# Patient Record
Sex: Female | Born: 1965 | Race: White | Hispanic: No | Marital: Married | Smoking: Never smoker
Health system: Southern US, Community
[De-identification: ages and names within clinical notes are randomized; demographics above are authoritative.]

## PROBLEM LIST (undated history)

## (undated) DIAGNOSIS — K5090 Crohn's disease, unspecified, without complications: Secondary | ICD-10-CM

## (undated) HISTORY — PX: COLON SURGERY: SHX602

## (undated) HISTORY — PX: CHOLECYSTECTOMY: SHX55

---

## 2004-04-09 ENCOUNTER — Ambulatory Visit (HOSPITAL_COMMUNITY): Admission: RE | Admit: 2004-04-09 | Discharge: 2004-04-09 | Payer: Self-pay | Admitting: *Deleted

## 2006-04-25 IMAGING — RF DG HYSTEROGRAM
5 series · 5 of 5 positions shown · IV contrast (omnipaque)
Comparison: none

CLINICAL DATA: Known Crohn?s disease.  Primary infertility.  Assess tubal patency.
HYSTEROGRAM:
Following sterile cleaning of the external cervical os with Betadine, hysterosalpingography was performed via placement of a 5 French hysterosalpingography catheter into the endocervical canal where it stabilized with an end catheter balloon.  Approximately 15 cc of Omnipaque 300 was injected into the endometrial Thomasgle without difficulty.  
A normal endometrial morphology is seen.  Both fallopian tubes demonstrate a normal morphology.  Bilateral intraperitoneal spill was documented, however on both sides the contrast appears to loculate in a periovarian location.  A small amount of free intraperitoneal spill is seen on the left, but none was clearly demonstrated on the right, and the overall appearance raises the possibility of bilateral periovarian adhesions.  On the post-drainage film loculation of contrast in both periovarian locations persists although some free intraperitoneal spill is confirmed on the left as well.

[Series 1: run · 1 of 1 slices shown (1 of 5)]
[im 1/1]
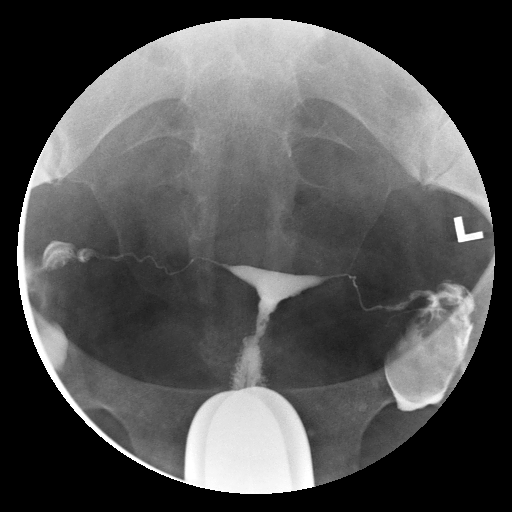

[Series 2: run · 1 of 1 slices shown (2 of 5)]
[im 1/1]
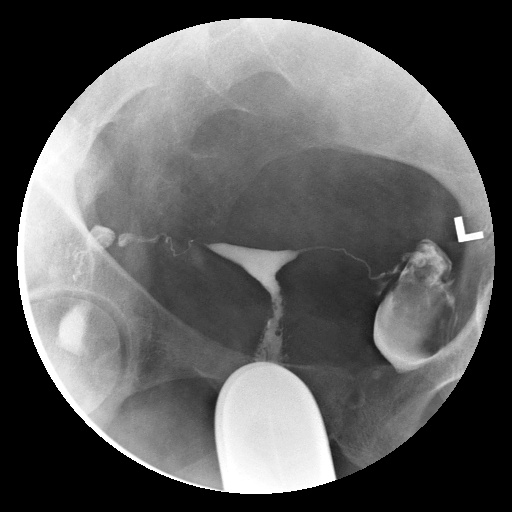

[Series 3: run · 1 of 1 slices shown (3 of 5)]
[im 1/1]
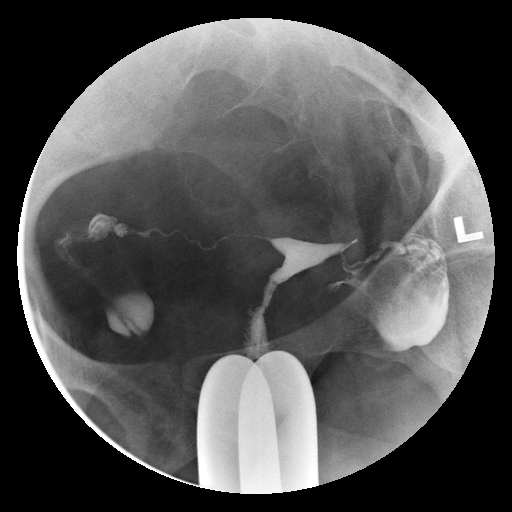

[Series 4: run · 1 of 1 slices shown (4 of 5)]
[im 1/1]
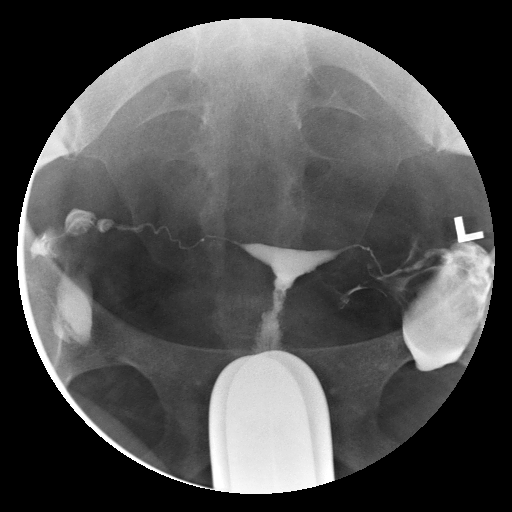

[Series 5: run · 1 of 1 slices shown (5 of 5)]
[im 1/1]
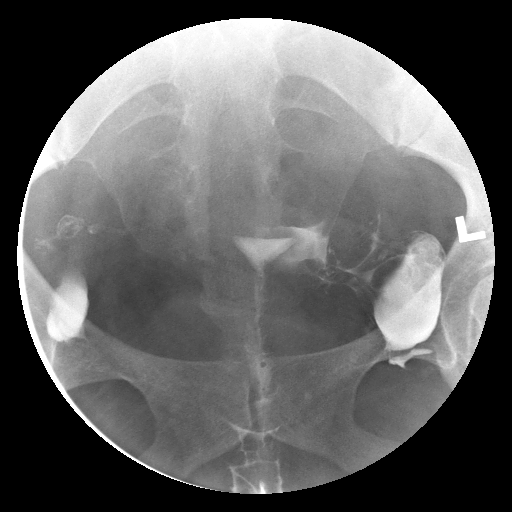

[5 of 5 positions shown; findings below may reference images not displayed]

IMPRESSION: Findings worrisome for bilateral periovarian adhesions as described above.

## 2010-03-29 ENCOUNTER — Encounter: Payer: Self-pay | Admitting: *Deleted

## 2017-03-27 ENCOUNTER — Other Ambulatory Visit: Payer: Self-pay

## 2017-03-27 ENCOUNTER — Encounter (HOSPITAL_BASED_OUTPATIENT_CLINIC_OR_DEPARTMENT_OTHER): Payer: Self-pay | Admitting: *Deleted

## 2017-03-27 ENCOUNTER — Emergency Department (HOSPITAL_BASED_OUTPATIENT_CLINIC_OR_DEPARTMENT_OTHER)
Admission: EM | Admit: 2017-03-27 | Discharge: 2017-03-27 | Disposition: A | Discharge status: Home / Self Care | Payer: PRIVATE HEALTH INSURANCE | Attending: Emergency Medicine | Admitting: Emergency Medicine

## 2017-03-27 DIAGNOSIS — R519 Headache, unspecified: Secondary | ICD-10-CM

## 2017-03-27 DIAGNOSIS — G501 Atypical facial pain: Secondary | ICD-10-CM | POA: Insufficient documentation

## 2017-03-27 DIAGNOSIS — R51 Headache: Secondary | ICD-10-CM | POA: Diagnosis not present

## 2017-03-27 HISTORY — DX: Crohn's disease, unspecified, without complications: K50.90

## 2017-03-27 MED ORDER — CYCLOBENZAPRINE HCL 10 MG PO TABS: 10.0000 mg | ORAL_TABLET | Freq: Two times a day (BID) | ORAL | 0 refills | Status: AC | PRN

## 2017-03-27 MED ORDER — CYCLOBENZAPRINE HCL 10 MG PO TABS
10.0000 mg | ORAL_TABLET | Freq: Once | ORAL | Status: AC
  Administered 2017-03-27: 10 mg via ORAL
  Filled 2017-03-27: qty 1

## 2017-03-27 MED ORDER — AMOXICILLIN 500 MG PO CAPS: 500.0000 mg | ORAL_CAPSULE | Freq: Three times a day (TID) | ORAL | 0 refills | Status: AC

## 2017-03-27 MED ORDER — HYDROCODONE-ACETAMINOPHEN 5-325 MG PO TABS
1.0000 | ORAL_TABLET | Freq: Once | ORAL | Status: AC
  Administered 2017-03-27: 1 via ORAL
  Filled 2017-03-27: qty 1

## 2017-03-27 MED ORDER — HYDROCODONE-ACETAMINOPHEN 5-325 MG PO TABS: 1.0000 | ORAL_TABLET | Freq: Four times a day (QID) | ORAL | 0 refills | Status: AC | PRN

## 2017-03-27 NOTE — ED Provider Notes (Signed)
[facility] EMERGENCY DEPARTMENT Provider Note   CSN: [id number] Arrival date & time: 03/27/17  2011     History   Chief Complaint Chief Complaint  Patient presents with  . Facial Pain    HPI Angel Porter is a 52 y.o. female.  Patient is a 52 year old female with a history of Crohn's disease presenting today with severe facial pain that Started over the last 4 days.  It initially started as an occasional searing pain over the left side of the jaw and TMJ area.  However of the days of progress the pain is become more frequent and more debilitating.  Patient does state that 3 months ago she had multiple dental procedures done to the teeth on that side of the mouth and had some issues with sensitivity and had to return to her dentist but it did been okay.  This started out of the blue.  It is slightly worse with opening her mouth but not necessarily related to temperature or if she is eating anything.  She states the pain comes and goes but when it comes its an electric sharp pain that is not improved by anything.  She has been taking Tylenol and ibuprofen without improvement.  Today she is getting multiple episodes an hour that last several minutes at a time.  No congestion, fever, difficulty swallowing.  She is getting some ear pain in addition to the facial pain.  No vision changes or eye pain.   The history is provided by the patient.    Past Medical History:  Diagnosis Date  . Crohn disease (HCC)     There are no active problems to display for this patient.   Past Surgical History:  Procedure Laterality Date  . CESAREAN SECTION    . CHOLECYSTECTOMY    . COLON SURGERY      OB History    No data available       Home Medications    Prior to Admission medications   Medication Sig Start Date End Date Taking? Authorizing Provider  amoxicillin (AMOXIL) 500 MG capsule Take 1 capsule (500 mg total) by mouth 3 (three) times daily. 03/27/17   Gwyneth SproutPlunkett, Chasten Blaze, MD    cyclobenzaprine (FLEXERIL) 10 MG tablet Take 1 tablet (10 mg total) by mouth 2 (two) times daily as needed for muscle spasms. 03/27/17   Gwyneth SproutPlunkett, Orion Vandervort, MD  HYDROcodone-acetaminophen (NORCO/VICODIN) 5-325 MG tablet Take 1-2 tablets by mouth every 6 (six) hours as needed for severe pain. 03/27/17   Gwyneth SproutPlunkett, Iley Breeden, MD    Family History No family history on file.  Social History Social History   Tobacco Use  . Smoking status: Never Smoker  . Smokeless tobacco: Never Used  Substance Use Topics  . Alcohol use: No    Frequency: Never  . Drug use: No     Allergies   Patient has no known allergies.   Review of Systems Review of Systems  All other systems reviewed and are negative.    Physical Exam Updated Vital Signs BP (!) 144/69 (BP Location: Left Arm)   Pulse 79   Temp 98.5 F (36.9 C) (Oral)   Resp 18   Ht 5\' 5"  (1.651 m)   Wt 114.3 kg (252 lb)   SpO2 99%   BMI 41.93 kg/m   Physical Exam  Constitutional: She is oriented to person, place, and time. She appears well-developed and well-nourished. No distress.  HENT:  Head: Normocephalic and atraumatic.  Left Ear: Tympanic membrane normal.  Mouth/Throat: Oropharynx is clear and moist. No trismus in the jaw. No uvula swelling or dental caries.    Eyes: EOM are normal. Pupils are equal, round, and reactive to light.  Cardiovascular: Normal rate.  Pulmonary/Chest: Effort normal.  Neurological: She is alert and oriented to person, place, and time.  No facial droop, sensation changes.  Extraocular movements are intact.  Skin: Skin is warm and dry.  Psychiatric: She has a normal mood and affect. Her behavior is normal.  Nursing note and vitals reviewed.    ED Treatments / Results  Labs (all labs ordered are listed, but only abnormal results are displayed) Labs Reviewed - No data to display  EKG  EKG Interpretation None       Radiology No results found.  Procedures Procedures (including critical  care time)  Medications Ordered in ED Medications  HYDROcodone-acetaminophen (NORCO/VICODIN) 5-325 MG per tablet 1 tablet (1 tablet Oral Given 03/27/17 2151)  cyclobenzaprine (FLEXERIL) tablet 10 mg (10 mg Oral Given 03/27/17 2150)     Initial Impression / Assessment and Plan / ED Course  I have reviewed the triage vital signs and the nursing notes.  Pertinent labs & imaging results that were available during my care of the patient were reviewed by me and considered in my medical decision making (see chart for details).     Patient presenting with facial pain.  This could be related to dental infection however could also be trigeminal neuralgia.  She displays no evidence of rash or any signs concerning for zoster.  She has no evidence of otitis.  Patient denies any visual symptoms and symptoms are not consistent with temporal arteritis.  Will treat for possible dental infection given recent dental work.  Patient given amoxicillin.  She was also given Flexeril and pain control as she is not having much luck with Tylenol and ibuprofen.  Patient given follow-up with neurology and she will also follow-up with her dentist.  Final Clinical Impressions(s) / ED Diagnoses   Final diagnoses:  Facial pain, acute    ED Discharge Orders        Ordered    cyclobenzaprine (FLEXERIL) 10 MG tablet  2 times daily PRN     03/27/17 2139    HYDROcodone-acetaminophen (NORCO/VICODIN) 5-325 MG tablet  Every 6 hours PRN     03/27/17 2139    amoxicillin (AMOXIL) 500 MG capsule  3 times daily     03/27/17 2139       Gwyneth Sprout, MD 03/27/17 2234

## 2017-03-27 NOTE — ED Notes (Signed)
EDP into room, prior to RN assessment, see MD notes, pending orders.   Alert, NAD, calm, interactive, resps e/u, speaking in clear complete sentences, no dyspnea noted, skin W&D, VSS, c/o pain felt in L face, L ear primarily in the L maxilla area, mentions dental work in this area ~ 1 month ago, states, "does not feel like it is dental or ear related", (denies: fever, rash, sob, sinus/congestion issues, NVD, sore throat, dizziness, hearing or visual changes), [facility]

## 2017-03-27 NOTE — Discharge Instructions (Signed)
Also need to follow up with your dentist

## 2017-03-27 NOTE — ED Triage Notes (Signed)
Pt reports left side facial pain x 4 days. Taking ibuprofen with minimal relief

## 2022-05-12 ENCOUNTER — Ambulatory Visit (INDEPENDENT_AMBULATORY_CARE_PROVIDER_SITE_OTHER): Payer: 59 | Admitting: Behavioral Health

## 2022-05-12 DIAGNOSIS — F331 Major depressive disorder, recurrent, moderate: Secondary | ICD-10-CM

## 2022-05-12 DIAGNOSIS — F411 Generalized anxiety disorder: Secondary | ICD-10-CM | POA: Diagnosis not present

## 2022-05-12 DIAGNOSIS — F4329 Adjustment disorder with other symptoms: Secondary | ICD-10-CM

## 2022-05-12 NOTE — Progress Notes (Signed)
[facility] Counselor Initial Adult Exam  Name: Angel Porter Date: 05/12/2022 MRN: [id number] DOB: 1965-03-23 PCP: Kristopher Glee., MD  Time spent: 58 minutes  Guardian/Payee:  self    Paperwork requested: No   Reason for Visit /Presenting Problem: anxiety, grief,depression  Angel Porter and is a 57 year old married female.  She currently lives with her husband and 3 year old special needs daughter.  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  She is basically nonverbal but does say a few words but more or less points to what she needs.  She has been married to her husband Angel Porter since May 20, 1993.  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  The patient has had multiple losses over the past several years.  Her biological mother died in May 20, 2016 and the patient was very close to her.  Her mother was the matriarch of the family and did all of the planning and the patient felt that she had to step up and do some of those type things.  She said that she married first, then her sister, then her brother at that her mother kept all things together with the extended family.  Growing up her father worked in Herbalist for a company so they moved a lot and she estimates 18 times and 26 years.  The patient was not as close to her father and he died in 05-21-19.  He was living with the patient and her husband at the time.  He continued to live in the home that he and the patient's mother were being but it became too big so they downsized.  She also helped care for her mother the last 42 or 7 years of her mother's life.  She had knee issues and was in a wheelchair.  She refused any surgery and believed in healing.  Her mother faith is important to her and that is something that she passed along to the patient and her  siblings.  One of the issues the patient wants to work on is the fact that she constantly feels the need to care for everyone else and does not take good care of herself.  She remembers going back to the home after the day of her mother's funeral and asking herself what do I do now and how do I care for myself.  She reports that she is always compliant and feels that she should do some of what her mother did.  Her mother always fixed dinner and so now dinnertime is the hardest and the patient's life to deal with her grief.  Her father after that did not want dinner with the family would want to sit in front of the TV.  When her father wanted to downsize and became paralyzed and trying to sell the house.  A thought they had some time but the household in 2 weeks so there were multiple adjustments in that time frame.  Her mother died in Jun 21, 2022, they moved and then her daughter started a new school in August 2018.  That was her first panic attack.  She felt as if she could not breathe.  The patient had a good relationship with her sister.  In 2017/05/20 she was diagnosed with endometrial cancer stage IV.  The patient said she felt that her sister's cancer  was treatable.  She had 2 chemotherapy treatments and did not feel well after the second treatment.  She was weak and sick.  After the third treatment her sister called her to tell her that she was being hospitalized because her sodium level was 1 significantly depleted and her white cell count was up.  When she went to see her sister at the hospital she noticed a huge black scab which her sister had not told anyone about.  That night she was rushed to surgery to fix that wound and the next day was in a coma and intubated.  While in surgery her white cell count rocket upwards and she was on a vent for 7 days.  Her blood pressure went up.  The patient says she was pushing for a bariatric MRI but was told he did not have that.  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  In mid June the sister moved in with the patient was very weak.  Physical therapy came to the home but the sister had a hard time with therapy.  She had to learn how to walk and eat again.  The patient questioned when she would have chemotherapy again in the sister said that doctors told her she had no current markers and that she was cancer free.  The COVID came and husband was in bed sick for 3 weeks.  Christmas Day 2020 the patient and her sister had a falling out.  The sister was back home with her husband at that point in time but he had to go to the hospital his sister stayed with the patient.  At about the same time her father started falling and jumbling his words.  Tests showed nothing but the patient and her sister went shopping and came home and found her father on the floor in the bathroom and could not get up.  He fell again hitting his head sending him to the hospital.  Her father went to the assisted living facility where the patient worked in Clinical biochemist of nursing told the patient and her father had a brain tumor which she had never heard.  She said it was slow going.  The father was there 20 days and died suddenly while the patient was over helping her sister get ready for her brother-in-law to come home.  She was in shock and said she laid on her father sobbing and did not want to leave the funeral.  She had to bury her father the day after her birthday.  Her parents are both buried in [place].  The patient and her sister talk fairly often after the sister got home but it was a stretch in March of the year when they did not speak much.  She told her husband that tomorrow she was going to talk to her sister and that night her brother-in-law called and said that her sister had stopped breathing and they could not resuscitate her.  Her husband died 11 days later  of some pre-existing health conditions.  The patient describes herself as feeling like she is in a bubble and wanting to get out.  She says that even at home she stays at home watching TV just because she wants to hear the noise.  She is happiest when she is at work.  Music is important to  her especially so the gospel plan recently Demetrius Charity saw on [place] whiskey has been very soothing to her.  The patient reports no use of alcohol or drugs.  The patient wants to feel that she can take care of herself and get out of this bubble.  For homework I encouraged her to think about even small things that she could do to practice self-care so that we can begin to look at that in the next session. Mental Status Exam: Appearance:   Well Groomed     Behavior:  Appropriate  Motor:  Normal  Speech/Language:   Clear and Coherent  Affect:  Appropriate  Mood:  anxious  Thought process:  normal  Thought content:    WNL  Sensory/Perceptual disturbances:    WNL  Orientation:  oriented to person, place, time/date, situation, day of week, month of year, and year  Attention:  Good  Concentration:  Good  Memory:  WNL  Fund of knowledge:   Good  Insight:    Good  Judgment:   Good  Impulse Control:  Good    Reported Symptoms:  anxiety, depression, grief  Risk Assessment: Danger to Self:  No Self-injurious Behavior: No Danger to Others: No Duty to Warn:no Physical Aggression / Violence:No  Access to Firearms a concern: No  Gang Involvement:No  Patient / guardian was educated about steps to take if suicide or homicide risk level increases between visits: n/a While future psychiatric events cannot be accurately predicted, the patient does not currently require acute inpatient psychiatric care and does not currently meet [facility] involuntary commitment criteria.  Substance Abuse History: Current substance abuse: No     Past Psychiatric History:   Previous psychological history is  significant for anxiety and depression Outpatient Providers:PCP History of Psych Hospitalization: No  Psychological Testing:  n/a    Abuse History:  Victim of: No.,  n/a    Report needed: No. Victim of Neglect:No. Perpetrator of  n/a   Witness / Exposure to Domestic Violence: No   Protective Services Involvement: No  Witness to [employer] Violence:  No   Family History: No family history on file.  Living situation: the patient lives with their family  Sexual Orientation: Straight  Relationship Status: married  Name of spouse / other: If a parent, number of children / ages:15 daughter  Support Systems: spouse friends family  Financial Stress:  No   Income/Employment/Disability: Employment  Armed forces logistics/support/administrative officer: No   Educational History: Education: high school diploma/GED  Religion/Sprituality/World View: Protestant  Any cultural differences that may affect / interfere with treatment:  not applicable   Recreation/Hobbies: time with family  Stressors: Health problems   Loss of multiple losses    Strengths: Supportive Relationships, Family, Church, Spirituality, Hopefulness, Conservator, museum/gallery, and Able to Communicate Effectively     Legal History:None Pending legal issue / charges: The patient has no significant history of legal issues. History of legal issue / charges:  n/a  Medical History/Surgical History: reviewed Past Medical History:  Diagnosis Date   Crohn disease ([place])     Past Surgical History:  Procedure Laterality Date   CESAREAN SECTION     CHOLECYSTECTOMY     COLON SURGERY      Medications: Current Outpatient Medications  Medication Sig Dispense Refill   amoxicillin (AMOXIL) 500 MG capsule Take 1 capsule (500 mg total) by mouth 3 (three) times daily. 21 capsule 0   cyclobenzaprine (FLEXERIL) 10 MG tablet Take 1 tablet (10 mg total) by mouth 2 (  two) times daily as needed for muscle spasms. 20 tablet 0   HYDROcodone-acetaminophen (NORCO/VICODIN)  5-325 MG tablet Take 1-2 tablets by mouth every 6 (six) hours as needed for severe pain. 10 tablet 0   No current facility-administered medications for this visit.    No Known Allergies  Diagnoses:  Generalized anxiety disorder, grief, major depressive disorder, recurrent, mild   Plan of Care: Follow-up appointments as scheduled   Sabas Sous, [facility]

## 2022-05-12 NOTE — Progress Notes (Signed)
                Cecilia Vancleve M Karter Haire, LCMHC 

## 2022-05-26 ENCOUNTER — Encounter: Payer: Self-pay | Admitting: Behavioral Health

## 2022-05-26 ENCOUNTER — Ambulatory Visit (INDEPENDENT_AMBULATORY_CARE_PROVIDER_SITE_OTHER): Payer: 59 | Admitting: Behavioral Health

## 2022-05-26 DIAGNOSIS — F4321 Adjustment disorder with depressed mood: Secondary | ICD-10-CM

## 2022-05-26 DIAGNOSIS — F331 Major depressive disorder, recurrent, moderate: Secondary | ICD-10-CM | POA: Diagnosis not present

## 2022-05-26 NOTE — Progress Notes (Signed)
                Angel Porter Angel Porter, LCMHC 

## 2022-05-26 NOTE — Progress Notes (Addendum)
[facility] Counselor/Therapist Progress Note  Patient ID: Angel Porter, MRN: [id number],    Date: 05/26/2022  Time Spent: 60 minutes spent in person with the patient in the therapist outpatient office.  Treatment Type: Individual Therapy  Reported Symptoms: Anxiety and depression  Mental Status Exam: Appearance:  Well Groomed     Behavior: Appropriate  Motor: Normal  Speech/Language:  Clear and Coherent  Affect: Appropriate  Mood: normal  Thought process: normal  Thought content:   WNL  Sensory/Perceptual disturbances:   N/a  Orientation: oriented to person, place, time/date, situation, day of week, month of year, and year  Attention: Good  Concentration: Good  Memory: WNL  Fund of knowledge:  Good  Insight:   Good  Judgment:  Good  Impulse Control: Good   Risk Assessment: Danger to Self:  No Self-injurious Behavior: No Danger to Others: No Duty to Warn:no Physical Aggression / Violence:No  Access to Firearms a concern: No  Gang Involvement:No   Subjective: I met with the patient face-to-face for today's session.  We will look more closely at some of her family dynamics and her responsibilities.  She recognizes complicated grief having lost multiple family members within a short amount of time.  She also has a special needs daughter which takes a lot of the patient's time and energy.  She has a great relationship with her daughter as with her husband but knows the extra pressure that comes with that.  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  We look specifically at her relationship with her husband as well as other family members including her brother extended family members and how they spend time together.  There is one family member in particular that we talked about today whom she was very fond of growing up but after her sister's death there was a strained relationship with this particular months.  She did have a conversation with her and appears to be managing that  relationship as well as setting healthy boundaries.  The patient does realize that she has very little time for herself and said the homework that I gave her to come up with some things that she could do for herself was a difficult task.  She likes to knit but rarely has time to do so.  I pointed out that things that she had been doing well including starting therapy for herself, telling her husband that it would be beneficial or, setting up boundaries with her and in terms of what she would allow the conversation to look like etc.  We did talk about some brief coping skills including relaxation breathing as well as a grounding exercise and will add more to that in following sessions specifically including progressive muscle relaxation.  Interventions: Cognitive Behavioral Therapy  Diagnosis: Complicated grief  Plan: To meet in person with the patient every 2 to 3 weeks .  Sabas Sous, [facility]

## 2022-06-08 ENCOUNTER — Telehealth (HOSPITAL_COMMUNITY): Payer: Self-pay | Admitting: Behavioral Health

## 2022-06-08 NOTE — Telephone Encounter (Signed)
Patient called the [facility] office and left a voicemail today 06/08/2022 @ 1231 cancelling her appointment  tomorrow 06/09/2022 with provider. Attempted to contact PCP office and [facility] to notify without success.

## 2022-06-09 ENCOUNTER — Ambulatory Visit: Payer: 59 | Admitting: Behavioral Health

## 2022-06-23 ENCOUNTER — Ambulatory Visit: Payer: 59 | Admitting: Behavioral Health

## 2022-12-14 ENCOUNTER — Encounter (HOSPITAL_BASED_OUTPATIENT_CLINIC_OR_DEPARTMENT_OTHER): Payer: Self-pay

## 2022-12-14 ENCOUNTER — Emergency Department (HOSPITAL_BASED_OUTPATIENT_CLINIC_OR_DEPARTMENT_OTHER)
Admission: EM | Admit: 2022-12-14 | Discharge: 2022-12-15 | Disposition: A | Discharge status: Home / Self Care | Payer: Managed Care, Other (non HMO) | Attending: Emergency Medicine | Admitting: Emergency Medicine

## 2022-12-14 ENCOUNTER — Other Ambulatory Visit: Payer: Self-pay

## 2022-12-14 DIAGNOSIS — M25561 Pain in right knee: Secondary | ICD-10-CM | POA: Insufficient documentation

## 2022-12-14 DIAGNOSIS — M25562 Pain in left knee: Secondary | ICD-10-CM | POA: Insufficient documentation

## 2022-12-14 MED ORDER — OXYCODONE-ACETAMINOPHEN 5-325 MG PO TABS
2.0000 | ORAL_TABLET | Freq: Once | ORAL | Status: AC
  Administered 2022-12-14: 2 via ORAL
  Filled 2022-12-14: qty 2

## 2022-12-14 MED ORDER — OXYCODONE-ACETAMINOPHEN 5-325 MG PO TABS
1.0000 | ORAL_TABLET | ORAL | 0 refills | Status: AC | PRN
Start: 2022-12-14 — End: 2023-12-14

## 2022-12-14 MED ORDER — OXYCODONE-ACETAMINOPHEN 5-325 MG PO TABS: 1.0000 | ORAL_TABLET | Freq: Once | ORAL | Status: DC

## 2022-12-14 NOTE — ED Provider Notes (Signed)
[place] [facility] Provider Note   CSN: [id number] Arrival date & time: 12/14/22  1853     History  Chief Complaint  Patient presents with   Knee Pain    Angel Porter is a 57 y.o. female.  Patient complains of bilateral knee pain.  Patient was seen at [facility] earlier today and had x-rays of both of her knees.  Patient reports she has not had any injury she has not had any falls.  Patient reports the pain began yesterday.  Patient states she could not sleep last night because of the severity of the pain.  Patient was started on prednisone at urgent care.  Patient complains of pain in her knees that goes down the front of her legs.  Patient is not currently on any hormone treatment she has not had any prolonged sitting.  No history of any type of cancer.  Patient denies any fever or chills.  The history is provided by the patient. No language interpreter was used.  Knee Pain      Home Medications Prior to Admission medications   Medication Sig Start Date End Date Taking? Authorizing Provider  oxyCODONE-acetaminophen (PERCOCET) 5-325 MG tablet Take 1 tablet by mouth every 4 (four) hours as needed for severe pain. 12/14/22 12/14/23 Yes Cheron Schaumann K, PA-C  amoxicillin (AMOXIL) 500 MG capsule Take 1 capsule (500 mg total) by mouth 3 (three) times daily. 03/27/17   Gwyneth Sprout, MD  cyclobenzaprine (FLEXERIL) 10 MG tablet Take 1 tablet (10 mg total) by mouth 2 (two) times daily as needed for muscle spasms. 03/27/17   Gwyneth Sprout, MD  HYDROcodone-acetaminophen (NORCO/VICODIN) 5-325 MG tablet Take 1-2 tablets by mouth every 6 (six) hours as needed for severe pain. 03/27/17   Gwyneth Sprout, MD      Allergies    Patient has no known allergies.    Review of Systems   Review of Systems  All other systems reviewed and are negative.   Physical Exam Updated Vital Signs BP (!) 177/97   Pulse 97   Temp 97.7 F (36.5 C)    Resp 20   Ht 5\' 5"  (1.651 m)   Wt 110.7 kg   SpO2 95%   BMI 40.60 kg/m  Physical Exam Vitals and nursing note reviewed.  Constitutional:      Appearance: She is well-developed.  HENT:     Head: Normocephalic.  Cardiovascular:     Rate and Rhythm: Normal rate.  Pulmonary:     Effort: Pulmonary effort is normal.  Abdominal:     General: There is no distension.  Musculoskeletal:        General: Tenderness present. No swelling. Normal range of motion.     Comments: Tender bilateral anterior shins and knees, no erythema no localized edema neurovascular neurosensory intact.  Patient able to ambulate.  Skin:    General: Skin is warm.  Neurological:     General: No focal deficit present.     Mental Status: She is alert and oriented to person, place, and time.     ED Results / Procedures / Treatments   Labs (all labs ordered are listed, but only abnormal results are displayed) Labs Reviewed - No data to display  EKG None  Radiology No results found.  Procedures Procedures    Medications Ordered in ED Medications  oxyCODONE-acetaminophen (PERCOCET/ROXICET) 5-325 MG per tablet 2 tablet (has no administration in time range)    ED Course/ Medical  Decision Making/ A&P                                 Medical Decision Making Patient complains of pain in both of her knees and down the front of her legs  Amount and/or Complexity of Data Reviewed External Data Reviewed: notes.    Details: Patient had x-rays done at [facility].  I cannot visualize x-rays or report  Risk Prescription drug management. Risk Details: Patient is requesting medication for pain relief.  She has prednisone at home.  Patient declined ultrasound evaluation I think DVT is unlikely as her pain is in the anterior shin and knees.  Patient is given 2 Percocet here she is given a prescription for a limited number I will give her 10 tablets.  Patient is advised to follow-up with the urgent care  tomorrow to make sure that her x-rays were read.  Patient is advised to schedule to see the orthopedist for evaluation.          Final Clinical Impression(s) / ED Diagnoses Final diagnoses:  Acute pain of both knees    Rx / DC Orders ED Discharge Orders          Ordered    oxyCODONE-acetaminophen (PERCOCET) 5-325 MG tablet  Every 4 hours PRN        12/14/22 2343           An After Visit Summary was printed and given to the patient.    Osie Cheeks 12/14/22 2348    Tilden Fossa, MD 12/15/22 [phone]

## 2022-12-14 NOTE — ED Triage Notes (Signed)
Pt to ED by POV from home with c/o bilateral nontraumatic knee pain which began yesterday. Pt was seen today at [facility], she had xrays done and started on prednisone. Pt states pain is so severe that she can't sleep. Arrives A+O, VSS, ambulatory to triage.

## 2022-12-14 NOTE — ED Notes (Signed)
Pt states pain is worse, EDP notified
